# Patient Record
Sex: Male | Born: 1985 | Race: White | Hispanic: No | Marital: Married | Smoking: Current some day smoker
Health system: Southern US, Community
[De-identification: ages and names within clinical notes are randomized; demographics above are authoritative.]

## PROBLEM LIST (undated history)

## (undated) ENCOUNTER — Ambulatory Visit (HOSPITAL_COMMUNITY): Admission: EM | Payer: No Typology Code available for payment source

## (undated) DIAGNOSIS — K589 Irritable bowel syndrome without diarrhea: Secondary | ICD-10-CM

## (undated) DIAGNOSIS — H9193 Unspecified hearing loss, bilateral: Secondary | ICD-10-CM

## (undated) DIAGNOSIS — D321 Benign neoplasm of spinal meninges: Secondary | ICD-10-CM

## (undated) DIAGNOSIS — N200 Calculus of kidney: Secondary | ICD-10-CM

## (undated) DIAGNOSIS — J189 Pneumonia, unspecified organism: Secondary | ICD-10-CM

## (undated) HISTORY — DX: Benign neoplasm of spinal meninges: D32.1

## (undated) HISTORY — DX: Unspecified hearing loss, bilateral: H91.93

## (undated) HISTORY — DX: Pneumonia, unspecified organism: J18.9

## (undated) HISTORY — DX: Calculus of kidney: N20.0

## (undated) HISTORY — DX: Irritable bowel syndrome, unspecified: K58.9

---

## 2021-03-24 ENCOUNTER — Other Ambulatory Visit: Payer: Self-pay

## 2021-03-24 ENCOUNTER — Encounter (HOSPITAL_COMMUNITY): Payer: Self-pay

## 2021-03-24 ENCOUNTER — Emergency Department (HOSPITAL_COMMUNITY): Payer: No Typology Code available for payment source

## 2021-03-24 ENCOUNTER — Emergency Department (HOSPITAL_COMMUNITY)
Admission: EM | Admit: 2021-03-24 | Discharge: 2021-03-24 | Disposition: A | Discharge status: Home / Self Care | Payer: No Typology Code available for payment source | Attending: Emergency Medicine | Admitting: Emergency Medicine

## 2021-03-24 DIAGNOSIS — R42 Dizziness and giddiness: Secondary | ICD-10-CM | POA: Insufficient documentation

## 2021-03-24 DIAGNOSIS — R1013 Epigastric pain: Secondary | ICD-10-CM | POA: Insufficient documentation

## 2021-03-24 DIAGNOSIS — R1011 Right upper quadrant pain: Secondary | ICD-10-CM | POA: Insufficient documentation

## 2021-03-24 DIAGNOSIS — K921 Melena: Secondary | ICD-10-CM | POA: Diagnosis present

## 2021-03-24 LAB — COMPREHENSIVE METABOLIC PANEL
ALT: 20 U/L (ref 0–44)
AST: 24 U/L (ref 15–41)
Albumin: 4.5 g/dL (ref 3.5–5.0)
Alkaline Phosphatase: 48 U/L (ref 38–126)
Anion gap: 8 (ref 5–15)
BUN: 16 mg/dL (ref 6–20)
CO2: 27 mmol/L (ref 22–32)
Calcium: 8.9 mg/dL (ref 8.9–10.3)
Chloride: 104 mmol/L (ref 98–111)
Creatinine, Ser: 0.87 mg/dL (ref 0.61–1.24)
GFR, Estimated: >60 mL/min (ref >60)
Glucose, Bld: 94 mg/dL (ref 70–99)
Potassium: 3.7 mmol/L (ref 3.5–5.1)
Sodium: 139 mmol/L (ref 135–145)
Total Bilirubin: 0.4 mg/dL (ref 0.3–1.2)
Total Protein: 7.6 g/dL (ref 6.5–8.1)

## 2021-03-24 LAB — CBC WITH DIFFERENTIAL/PLATELET
Abs Immature Granulocytes: 0.05 10*3/uL (ref 0.00–0.07)
Basophils Absolute: 0.1 10*3/uL (ref 0.0–0.1)
Basophils Relative: 1 %
Eosinophils Absolute: 0.1 10*3/uL (ref 0.0–0.5)
Eosinophils Relative: 1 %
HCT: 42.6 % (ref 39.0–52.0)
Hemoglobin: 14.1 g/dL (ref 13.0–17.0)
Immature Granulocytes: 1 %
Lymphocytes Relative: 33 %
Lymphs Abs: 2.8 10*3/uL (ref 0.7–4.0)
MCH: 29.6 pg (ref 26.0–34.0)
MCHC: 33.1 g/dL (ref 30.0–36.0)
MCV: 89.3 fL (ref 80.0–100.0)
Monocytes Absolute: 0.5 10*3/uL (ref 0.1–1.0)
Monocytes Relative: 6 %
Neutro Abs: 5 10*3/uL (ref 1.7–7.7)
Neutrophils Relative %: 58 %
Platelets: 196 10*3/uL (ref 150–400)
RBC: 4.77 MIL/uL (ref 4.22–5.81)
RDW: 12.7 % (ref 11.5–15.5)
WBC: 8.6 10*3/uL (ref 4.0–10.5)
nRBC: 0 % (ref 0.0–0.2)

## 2021-03-24 LAB — POC OCCULT BLOOD, ED: Fecal Occult Bld: POSITIVE — AB

## 2021-03-24 LAB — LIPASE, BLOOD: Lipase: 32 U/L (ref 11–51)

## 2021-03-24 MED ORDER — IOHEXOL 300 MG/ML  SOLN
100.0000 mL | Freq: Once | INTRAMUSCULAR | Status: AC | PRN
  Administered 2021-03-24: 100 mL via INTRAVENOUS

## 2021-03-24 MED ORDER — PANTOPRAZOLE SODIUM 20 MG PO TBEC: 20.0000 mg | DELAYED_RELEASE_TABLET | Freq: Every day | ORAL | 0 refills | Status: AC

## 2021-03-24 NOTE — ED Triage Notes (Signed)
Pt reports bleeding from his rectum x 3 days. Pt states that his stool has clots and light red bleeding. Pt reports feeling tired.  ?

## 2021-03-24 NOTE — ED Notes (Signed)
Pt NAD, a/ox4. Pt verbalizes understanding of all DC and f/u instructions. All questions answered. Pt walks with steady gait to lobby at DC.  ? ?

## 2021-03-24 NOTE — ED Notes (Signed)
Pt NAD in bed, a/ox4, c/o RUQ abd pain and bright red rectal bleeding. Pt states pain increases after eating, family HX gallstones. ABD soft, tender to RUQ.  ?

## 2021-03-24 NOTE — ED Provider Notes (Signed)
?[place] DEPT ?Provider Note ? ? ?CSN: [id number] ?Arrival date & time: 03/24/21  1943 ? ?  ? ?History ? ?Chief Complaint  ?Patient presents with  ? Blood In Stools  ? ? ?David Rivers. is a 36 y.o. male. ? ?Patient is a 36 year old male who presents with blood in the stool.  He has had some pain in his right upper abdomen for about 3 to 4 days.  He is also noticed some blood in his stools for the same amount of time.  It got worse today.  He started having pure blood in the toilet.  He has had 3 episodes of bloody stools today.  He denies any nausea or vomiting.  He felt a little foggy and a little bit dizzy earlier today.  No known fevers.  He said he had some blood in his stools several months ago but it went away pretty fast and he thought it was just hemorrhoids. ? ? ?  ? ?Home Medications ?Prior to Admission medications   ?Medication Sig Start Date End Date Taking? Authorizing Provider  ?pantoprazole (PROTONIX) 20 MG tablet Take 1 tablet (20 mg total) by mouth daily. 03/24/21  Yes Malvin Johns, MD  ?   ? ?Allergies    ?Patient has no allergy information on record.   ? ?Review of Systems   ?Review of Systems  ?Constitutional:  Negative for chills, diaphoresis, fatigue and fever.  ?HENT:  Negative for congestion, rhinorrhea and sneezing.   ?Eyes: Negative.   ?Respiratory:  Negative for cough, chest tightness and shortness of breath.   ?Cardiovascular:  Negative for chest pain and leg swelling.  ?Gastrointestinal:  Positive for abdominal pain and blood in stool. Negative for diarrhea, nausea and vomiting.  ?Genitourinary:  Negative for difficulty urinating, flank pain, frequency and hematuria.  ?Musculoskeletal:  Negative for arthralgias and back pain.  ?Skin:  Negative for rash.  ?Neurological:  Negative for dizziness, speech difficulty, weakness, numbness and headaches.  ? ?Physical Exam ?Updated Vital Signs ?BP 136/90   Pulse 72   Temp 98.1 ?F (36.7 ?C) (Oral)   Resp  16   Ht 6' (1.829 m)   Wt 90.7 kg   SpO2 98%   BMI 27.12 kg/m?  ?Physical Exam ?Constitutional:   ?   Appearance: He is well-developed.  ?HENT:  ?   Head: Normocephalic and atraumatic.  ?Eyes:  ?   Pupils: Pupils are equal, round, and reactive to light.  ?Cardiovascular:  ?   Rate and Rhythm: Normal rate and regular rhythm.  ?   Heart sounds: Normal heart sounds.  ?Pulmonary:  ?   Effort: Pulmonary effort is normal. No respiratory distress.  ?   Breath sounds: Normal breath sounds. No wheezing or rales.  ?Chest:  ?   Chest wall: No tenderness.  ?Abdominal:  ?   General: Bowel sounds are normal.  ?   Palpations: Abdomen is soft.  ?   Tenderness: There is no abdominal tenderness (RUQ, epigastric). There is no guarding or rebound.  ?Genitourinary: ?   Comments: Rectal exam was negative for stool, negative for gross blood ?Musculoskeletal:     ?   General: Normal range of motion.  ?   Cervical back: Normal range of motion and neck supple.  ?Lymphadenopathy:  ?   Cervical: No cervical adenopathy.  ?Skin: ?   General: Skin is warm and dry.  ?   Findings: No rash.  ?Neurological:  ?   Mental Status: He  is alert and oriented to person, place, and time.  ? ? ?ED Results / Procedures / Treatments   ?Labs ?(all labs ordered are listed, but only abnormal results are displayed) ?Labs Reviewed  ?POC OCCULT BLOOD, ED - Abnormal; Notable for the following components:  ?    Result Value  ? Fecal Occult Bld POSITIVE (*)   ? All other components within normal limits  ?CBC WITH DIFFERENTIAL/PLATELET  ?COMPREHENSIVE METABOLIC PANEL  ?LIPASE, BLOOD  ? ? ?EKG ?None ? ?Radiology ?CT Abdomen Pelvis W Contrast ? ?Result Date: 03/24/2021 ?CLINICAL DATA:  Acute abdominal pain and rectal bleeding, initial encounter EXAM: CT ABDOMEN AND PELVIS WITH CONTRAST TECHNIQUE: Multidetector CT imaging of the abdomen and pelvis was performed using the standard protocol following bolus administration of intravenous contrast. RADIATION DOSE REDUCTION:  This exam was performed according to the departmental dose-optimization program which includes automated exposure control, adjustment of the mA and/or kV according to patient size and/or use of iterative reconstruction technique. CONTRAST:  164m OMNIPAQUE IOHEXOL 300 MG/ML  SOLN COMPARISON:  None. FINDINGS: Lower chest: No acute abnormality. Hepatobiliary: No focal liver abnormality is seen. No gallstones, gallbladder wall thickening, or biliary dilatation. Pancreas: Unremarkable. No pancreatic ductal dilatation or surrounding inflammatory changes. Spleen: Normal in size without focal abnormality. Adrenals/Urinary Tract: Adrenal glands are within normal limits. Kidneys are within normal limits as well. No renal calculi are seen. The ureters are within normal limits. Bladder is well distended. Stomach/Bowel: The appendix is within normal limits. No obstructive or inflammatory changes of the colon are seen. Small bowel and stomach are unremarkable. Vascular/Lymphatic: No significant vascular findings are present. No enlarged abdominal or pelvic lymph nodes. Reproductive: Prostate is unremarkable. Other: No abdominal wall hernia or abnormality. No abdominopelvic ascites. Musculoskeletal: No acute or significant osseous findings. IMPRESSION: No acute abnormality noted to correspond with the patient's given clinical symptomatology. Electronically Signed   By: MInez CatalinaM.D.   On: 03/24/2021 21:57   ? ?Procedures ?Procedures  ? ? ?Medications Ordered in ED ?Medications  ?iohexol (OMNIPAQUE) 300 MG/ML solution 100 mL (100 mLs Intravenous Contrast Given 03/24/21 2146)  ? ? ?ED Course/ Medical Decision Making/ A&P ?  ?                        ?Medical Decision Making ?Problems Addressed: ?Epigastric pain: undiagnosed new problem with uncertain prognosis ?Hematochezia: undiagnosed new problem with uncertain prognosis ? ?Amount and/or Complexity of Data Reviewed ?Labs: ordered. Decision-making details documented in ED  Course. ?Radiology: ordered. Decision-making details documented in ED Course. ? ?Risk ?Prescription drug management. ?Decision regarding hospitalization. ? ? ?Patient is a 36year old male who presents with bright red blood per rectum.  He also has some right upper quadrant and epigastric abdominal pain.  His labs are nonconcerning.  His lipase is normal without suggestions of pancreatitis.  His LFTs are normal.  His hemoglobin is normal.  Rectal exam showed no stool or blood.  It was Hemoccult positive however.  There is no visible external hemorrhoids noted.  Given his abdominal pain, CT scan was performed which showed no acute abnormalities.  There is no gallstones or gallbladder wall thickening.  He has minimal tenderness on exam.  He seems appropriate for outpatient treatment.  At this point he does not have any anemia, hypotension or ongoing bleeding that would warrant inpatient treatment.  I will give him a referral to follow-up with gastroenterology.  Return precautions were discussed.  We will also start  Protonix for his upper abdominal pain. ? ?{Final Clinical Impression(s) / ED Diagnoses ?Final diagnoses:  ?Hematochezia  ?Epigastric pain  ? ? ?Rx / DC Orders ?ED Discharge Orders   ? ?      Ordered  ?  pantoprazole (PROTONIX) 20 MG tablet  Daily       ? 03/24/21 2219  ? ?  ?  ? ?  ? ? ?  ?Malvin Johns, MD ?03/24/21 2224 ? ?

## 2021-03-24 NOTE — Discharge Instructions (Signed)
Follow-up with the gastroenterologist as discussed.  Return to the emergency room if you have any worsening symptoms. ?

## 2021-03-24 NOTE — ED Notes (Signed)
Patient transported to CT 

## 2021-03-26 ENCOUNTER — Encounter: Payer: Self-pay | Admitting: Gastroenterology

## 2021-04-15 ENCOUNTER — Ambulatory Visit: Payer: No Typology Code available for payment source | Admitting: Gastroenterology

## 2021-04-15 ENCOUNTER — Other Ambulatory Visit: Payer: No Typology Code available for payment source

## 2021-04-15 ENCOUNTER — Encounter: Payer: Self-pay | Admitting: Gastroenterology

## 2021-04-15 VITALS — BP 120/72 | HR 78 | Ht 72.0 in | Wt 233.0 lb

## 2021-04-15 DIAGNOSIS — R195 Other fecal abnormalities: Secondary | ICD-10-CM | POA: Diagnosis not present

## 2021-04-15 DIAGNOSIS — K219 Gastro-esophageal reflux disease without esophagitis: Secondary | ICD-10-CM

## 2021-04-15 DIAGNOSIS — K921 Melena: Secondary | ICD-10-CM

## 2021-04-15 MED ORDER — PLENVU 140 G PO SOLR: ORAL | 0 refills | Status: DC

## 2021-04-15 NOTE — Patient Instructions (Addendum)
If you are age 34 or older, your body mass index should be between 23-30. Your Body mass index is 31.6 kg/m?Marland Kitchen If this is out of the aforementioned range listed, please consider follow up with your Primary Care Provider. ? ?If you are age 41 or younger, your body mass index should be between 19-25. Your Body mass index is 31.6 kg/m?Marland Kitchen If this is out of the aformentioned range listed, please consider follow up with your Primary Care Provider.  ? ?You have been scheduled for a colonoscopy. Please follow written instructions given to you at your visit today.  ?Please pick up your prep supplies at the pharmacy within the next 1-3 days. ?If you use inhalers (even only as needed), please bring them with you on the day of your procedure. ? ? ?Your provider has requested that you go to the basement level for lab work before leaving today. Press "B" on the elevator. The lab is located at the first door on the left as you exit the elevator.  ? ?Take Protonix daily.  ? ?Increase dietary fiber. ? ? ?The [facility] providers would like to encourage you to use [facility] to communicate with providers for non-urgent requests or questions.  Due to long hold times on the telephone, sending your provider a message by [facility] may be a faster and more efficient way to get a response.  Please allow 48 business hours for a response.  Please remember that this is for non-urgent requests.  ? ?It was a pleasure to see you today! ? ?Thank you for trusting me with your gastrointestinal care!   ? ?Scott E.Candis Schatz, MD  ?

## 2021-04-15 NOTE — Progress Notes (Signed)
? ?HPI : David Rivers is a very pleasant 36 year old male who presents to [place] with symptoms of rectal bleeding and dyspepsia.  He for started noticing blood in his stool about a year ago.  He will see blood sometimes mixed within his stool, sometimes just on the toilet paper and sometimes he just passes blood without passing any stool.  He denies any pain with the passage of stool and denies feeling any lumps bumps or protrusions in the perianal area.  He reports having normal bowel habits, usually with 2 bowel movements per day.  The consistency of his stool varies quite a bit from soft formed stool to loose, mushy stools, rarely with hard stools. ?He has also been having upper GI symptoms recently consisting of a right upper quadrant discomfort, heartburn and excessive belching dyspepsia after meals.  He went to the emergency department earlier this month because of the bleeding rectal bleeding and the pain.  Fecal occult blood test in the ED was positive.  CT scan was unremarkable.  Liver enzymes and lipase were normal.  He was started on Protonix, and this seems to have helped with his upper GI symptoms.  He is also started eating healthier since that ED visit, decreasing consumption of fried and fatty foods, and eating more fruits and vegetables. ? ?He denies any family history of celiac disease, inflammatory bowel disease or gallbladder disease.  No family history of GI malignancy.  He has never had an upper or lower endoscopy.  No unintentional weight loss. ? ?Past Medical History:  ?Diagnosis Date  ? IBS (irritable bowel syndrome)   ? Kidney stones   ? Partial deafness of both ears   ? Pneumonia   ? Spinal meningioma ([place])   ? ? ? ?History reviewed. No pertinent surgical history. ?Family History  ?Problem Relation Age of Onset  ? Multiple sclerosis Mother   ? Liver cancer Maternal Grandfather   ?     kidney cancer  ? COPD Paternal Grandmother   ? Breast cancer Paternal Grandmother 24  ? COPD Paternal  Grandfather   ? Diabetes Paternal Grandfather   ? Colon cancer Neg Hx   ? ?Social History  ? ?Tobacco Use  ? Smoking status: Some Days  ?  Types: Cigarettes  ? Smokeless tobacco: Current  ?  Types: Chew  ?Vaping Use  ? Vaping Use: Never used  ?Substance Use Topics  ? Alcohol use: Yes  ?  Comment: occ  ? Drug use: Never  ? ?Current Outpatient Medications  ?Medication Sig Dispense Refill  ? Multiple Vitamin (MULTIVITAMIN) capsule Take 1 capsule by mouth daily.    ? pantoprazole (PROTONIX) 20 MG tablet Take 1 tablet (20 mg total) by mouth daily. (Patient taking differently: Take 20 mg by mouth daily as needed.) 30 tablet 0  ? ?No current facility-administered medications for this visit.  ? ?No Known Allergies ? ? ?Review of Systems: ?All systems reviewed and negative except where noted in HPI.  ? ? ?CT Abdomen Pelvis W Contrast ? ?Result Date: 03/24/2021 ?CLINICAL DATA:  Acute abdominal pain and rectal bleeding, initial encounter EXAM: CT ABDOMEN AND PELVIS WITH CONTRAST TECHNIQUE: Multidetector CT imaging of the abdomen and pelvis was performed using the standard protocol following bolus administration of intravenous contrast. RADIATION DOSE REDUCTION: This exam was performed according to the departmental dose-optimization program which includes automated exposure control, adjustment of the mA and/or kV according to patient size and/or use of iterative reconstruction technique. CONTRAST:  195m OMNIPAQUE IOHEXOL  300 MG/ML  SOLN COMPARISON:  None. FINDINGS: Lower chest: No acute abnormality. Hepatobiliary: No focal liver abnormality is seen. No gallstones, gallbladder wall thickening, or biliary dilatation. Pancreas: Unremarkable. No pancreatic ductal dilatation or surrounding inflammatory changes. Spleen: Normal in size without focal abnormality. Adrenals/Urinary Tract: Adrenal glands are within normal limits. Kidneys are within normal limits as well. No renal calculi are seen. The ureters are within normal limits.  Bladder is well distended. Stomach/Bowel: The appendix is within normal limits. No obstructive or inflammatory changes of the colon are seen. Small bowel and stomach are unremarkable. Vascular/Lymphatic: No significant vascular findings are present. No enlarged abdominal or pelvic lymph nodes. Reproductive: Prostate is unremarkable. Other: No abdominal wall hernia or abnormality. No abdominopelvic ascites. Musculoskeletal: No acute or significant osseous findings. IMPRESSION: No acute abnormality noted to correspond with the patient's given clinical symptomatology. Electronically Signed   By: Inez Catalina M.D.   On: 03/24/2021 21:57   ? ?Physical Exam: ?BP 120/72   Pulse 78   Ht 6' (1.829 m)   Wt 233 lb (105.7 kg)   SpO2 96%   BMI 31.60 kg/m?  ?Constitutional: Pleasant,well-developed, Caucasian male in no acute distress. ?HEENT: Normocephalic and atraumatic. Conjunctivae are normal. No scleral icterus. ?Neck supple.  ?Cardiovascular: Normal rate, regular rhythm.  ?Pulmonary/chest: Effort normal and breath sounds normal. No wheezing, rales or rhonchi. ?Abdominal: Soft, nondistended, mild tenderness to palpation in the epigastrium, no rigidity or guarding. Bowel sounds active throughout. There are no masses palpable. No hepatomegaly. ?Extremities: no edema ?Rectal: Deferred until time of colonoscopy ?Neurological: Alert and oriented to person place and time. ?Skin: Skin is warm and dry. No rashes noted. ?Psychiatric: Normal mood and affect. Behavior is normal. ? ?CBC ?   ?Component Value Date/Time  ? WBC 8.6 03/24/2021 2006  ? RBC 4.77 03/24/2021 2006  ? HGB 14.1 03/24/2021 2006  ? HCT 42.6 03/24/2021 2006  ? PLT 196 03/24/2021 2006  ? MCV 89.3 03/24/2021 2006  ? MCH 29.6 03/24/2021 2006  ? MCHC 33.1 03/24/2021 2006  ? RDW 12.7 03/24/2021 2006  ? LYMPHSABS 2.8 03/24/2021 2006  ? MONOABS 0.5 03/24/2021 2006  ? EOSABS 0.1 03/24/2021 2006  ? BASOSABS 0.1 03/24/2021 2006  ? ? ?CMP  ?   ?Component Value Date/Time  ?  NA 139 03/24/2021 2006  ? K 3.7 03/24/2021 2006  ? CL 104 03/24/2021 2006  ? CO2 27 03/24/2021 2006  ? GLUCOSE 94 03/24/2021 2006  ? BUN 16 03/24/2021 2006  ? CREATININE 0.87 03/24/2021 2006  ? CALCIUM 8.9 03/24/2021 2006  ? PROT 7.6 03/24/2021 2006  ? ALBUMIN 4.5 03/24/2021 2006  ? AST 24 03/24/2021 2006  ? ALT 20 03/24/2021 2006  ? ALKPHOS 48 03/24/2021 2006  ? BILITOT 0.4 03/24/2021 2006  ? GFRNONAA >60 03/24/2021 2006  ? ? ? ?ASSESSMENT AND PLAN: ?36 year old male with less than 1 year history of intermittent hematochezia, recently worsened in terms of volume and frequency.  He is also been having upper GI symptoms of dyspepsia, excessive belching and bloating.  Bowel movements have been normal.  He has no other perianal symptoms such as pain with defecation or itching/burning.  I told him that his bleeding is almost certainly from internal hemorrhoids, but a colonoscopy is reasonable to exclude other etiologies.  The patient has a positive fecal occult blood test, which essentially mandates a diagnostic colonoscopy at this point. ?With regards to his upper GI symptoms, recommended he take the Protonix every day not just  as needed for least a month.  I would like to check an H. pylori stool antigen and also screen for celiac disease.  I recommended that he start taking a daily fiber supplement to increase his stool bulk and consistency and potentially reduce hemorrhoidal bleeding. ? ?Hematochezia/positive FOBT, suspected hemorrhoids ?- Colonoscopy ?-Fiber supplementation ? ?GERD/dyspepsia ?-Protonix daily, not PRN ?-H. Pylori stool antigen ?-Ttg/IGA ? ?The details, risks (including bleeding, perforation, infection, missed lesions, medication reactions and possible hospitalization or surgery if complications occur), benefits, and alternatives to colonoscopy with possible biopsy and possible polypectomy were discussed with the patient and he consents to proceed.  ? ?Maryln Eastham E. Candis Schatz, MD ?[facility]  Gastroenterology ? ? ?No ref. provider found ? ?

## 2021-04-16 ENCOUNTER — Encounter: Payer: Self-pay | Admitting: Gastroenterology

## 2021-04-16 LAB — IGA: Immunoglobulin A: 190 mg/dL (ref 47–310)

## 2021-04-16 LAB — TISSUE TRANSGLUTAMINASE, IGA: (tTG) Ab, IgA: <1 U/mL

## 2021-05-07 ENCOUNTER — Encounter: Payer: Self-pay | Admitting: Gastroenterology

## 2021-05-13 ENCOUNTER — Telehealth: Payer: Self-pay | Admitting: Gastroenterology

## 2021-05-13 ENCOUNTER — Other Ambulatory Visit: Payer: Self-pay

## 2021-05-13 MED ORDER — PLENVU 140 G PO SOLR: ORAL | 0 refills | Status: DC

## 2021-05-13 NOTE — Telephone Encounter (Signed)
Patient called states he call the pharmacy and they do not have his prep medication.  ?

## 2021-05-13 NOTE — Telephone Encounter (Signed)
Called pharmacy and spoke with the Pharmacist sunjay who stated that they had the prescription but they did not have the medication in stock. Attempted to call patient and alternate contact to pick up prep kit but received no answer.  ?

## 2021-05-13 NOTE — Telephone Encounter (Signed)
Left VM for patient to call back immediately to discuss prep options  ?

## 2021-05-13 NOTE — Telephone Encounter (Signed)
Patient rescheduled for May 9th and will come pick up prep kit tomorrow. ?

## 2021-05-13 NOTE — Telephone Encounter (Signed)
Patient called to reschedule his procedure for tomorrow. Patient rescheduled for 05/27/21. Per patient, didn't pick up medications on time.  ?

## 2021-05-14 ENCOUNTER — Encounter: Payer: No Typology Code available for payment source | Admitting: Gastroenterology

## 2021-05-26 NOTE — Telephone Encounter (Signed)
Patient called to pick up his pre medication call front desk and it was not available please confirm and call patient to let him know. ?

## 2021-05-26 NOTE — Telephone Encounter (Signed)
Returned patient call and let him I did have a prep for him to come pick it up on the 2nd floor. Patient informed me that he needed to go over prep instructions again before he leaves. ?

## 2021-05-26 NOTE — Telephone Encounter (Signed)
Patient came by office and we went over instructions together. Patient stated he ate sunflowers seeds 1hour prior to coming to the office. Per Dr.Cunningham patient can still have procedure. Patient called once returning home stating that he also had beef jerky earlier. I let patient know that as long as he DID NOT eat anything else he could have his procedure. Patient asked if it was a banana or a banana flavored popsicle he was able to have. I let patient know that it was popsicle and he could not have anymore solid foods until after his procedure. ?

## 2021-05-27 ENCOUNTER — Encounter: Payer: Self-pay | Admitting: Gastroenterology

## 2021-05-27 ENCOUNTER — Ambulatory Visit (AMBULATORY_SURGERY_CENTER): Payer: No Typology Code available for payment source | Admitting: Gastroenterology

## 2021-05-27 VITALS — BP 108/73 | HR 63 | Temp 98.6°F | Resp 20 | Ht 72.0 in | Wt 233.0 lb

## 2021-05-27 DIAGNOSIS — R195 Other fecal abnormalities: Secondary | ICD-10-CM

## 2021-05-27 DIAGNOSIS — K921 Melena: Secondary | ICD-10-CM

## 2021-05-27 DIAGNOSIS — K635 Polyp of colon: Secondary | ICD-10-CM

## 2021-05-27 DIAGNOSIS — K640 First degree hemorrhoids: Secondary | ICD-10-CM | POA: Diagnosis not present

## 2021-05-27 DIAGNOSIS — D123 Benign neoplasm of transverse colon: Secondary | ICD-10-CM

## 2021-05-27 MED ORDER — SODIUM CHLORIDE 0.9 % IV SOLN: 500.0000 mL | Freq: Once | INTRAVENOUS | Status: DC

## 2021-05-27 NOTE — Progress Notes (Signed)
Called to room to assist during endoscopic procedure.  Patient ID and intended procedure confirmed with present staff. Received instructions for my participation in the procedure from the performing physician.  

## 2021-05-27 NOTE — Progress Notes (Signed)
Pt's states no medical or surgical changes since previsit or office visit. VS assessed by C.W 

## 2021-05-27 NOTE — Patient Instructions (Signed)
Handouts provided on polyps, hemorrhoids and high-fiber diet.  ? ?Recommend fiber supplementation to reduce hemorrhoidal bleeding.  ? ?YOU HAD AN ENDOSCOPIC PROCEDURE TODAY AT [facility]:   Refer to the procedure report that was given to you for any specific questions about what was found during the examination.  If the procedure report does not answer your questions, please call your gastroenterologist to clarify.  If you requested that your care partner not be given the details of your procedure findings, then the procedure report has been included in a sealed envelope for you to review at your convenience later. ? ?YOU SHOULD EXPECT: Some feelings of bloating in the abdomen. Passage of more gas than usual.  Walking can help get rid of the air that was put into your GI tract during the procedure and reduce the bloating. If you had a lower endoscopy (such as a colonoscopy or flexible sigmoidoscopy) you may notice spotting of blood in your stool or on the toilet paper. If you underwent a bowel prep for your procedure, you may not have a normal bowel movement for a few days. ? ?Please Note:  You might notice some irritation and congestion in your nose or some drainage.  This is from the oxygen used during your procedure.  There is no need for concern and it should clear up in a day or so. ? ?SYMPTOMS TO REPORT IMMEDIATELY: ? ?Following lower endoscopy (colonoscopy or flexible sigmoidoscopy): ? Excessive amounts of blood in the stool ? Significant tenderness or worsening of abdominal pains ? Swelling of the abdomen that is new, acute ? Fever of 100?F or higher ? ?For urgent or emergent issues, a gastroenterologist can be reached at any hour by calling [phone]. ?Do not use MyChart messaging for urgent concerns.  ? ? ?DIET:  We do recommend a small meal at first, but then you may proceed to your regular diet.  Drink plenty of fluids but you should avoid alcoholic beverages for 24  hours. ? ?ACTIVITY:  You should plan to take it easy for the rest of today and you should NOT DRIVE or use heavy machinery until tomorrow (because of the sedation medicines used during the test).   ? ?FOLLOW UP: ?Our staff will call the number listed on your records 48-72 hours following your procedure to check on you and address any questions or concerns that you may have regarding the information given to you following your procedure. If we do not reach you, we will leave a message.  We will attempt to reach you two times.  During this call, we will ask if you have developed any symptoms of COVID 19. If you develop any symptoms (ie: fever, flu-like symptoms, shortness of breath, cough etc.) before then, please call [phone].  If you test positive for Covid 19 in the 2 weeks post procedure, please call and report this information to [place].   ? ?If any biopsies were taken you will be contacted by phone or by letter within the next 1-3 weeks.  Please call us at [phone] if you have not heard about the biopsies in 3 weeks.  ? ? ?SIGNATURES/CONFIDENTIALITY: ?You and/or your care partner have signed paperwork which will be entered into your electronic medical record.  These signatures attest to the fact that that the information above on your After Visit Summary has been reviewed and is understood.  Full responsibility of the confidentiality of this discharge information lies with you and/or your care-partner. ? ?

## 2021-05-27 NOTE — Op Note (Signed)
[facility] ?Patient Name: David Rivers ?Procedure Date: 05/27/2021 3:38 PM ?MRN: [id number] ?Endoscopist: Raad Clayson E. Candis Schatz , MD ?Age: 36 ?Referring MD:  ?Date of Birth: 1985-10-09 ?Gender: Male ?Account #: [id number] ?Procedure:                Colonoscopy ?Indications:              Hematochezia, Positive fecal immunochemical test ?Medicines:                Monitored Anesthesia Care ?Procedure:                Pre-Anesthesia Assessment: ?                          - Prior to the procedure, a History and Physical  ?                          was performed, and patient medications and  ?                          allergies were reviewed. The patient's tolerance of  ?                          previous anesthesia was also reviewed. The risks  ?                          and benefits of the procedure and the sedation  ?                          options and risks were discussed with the patient.  ?                          All questions were answered, and informed consent  ?                          was obtained. Prior Anticoagulants: The patient has  ?                          taken no previous anticoagulant or antiplatelet  ?                          agents. ASA Grade Assessment: II - A patient with  ?                          mild systemic disease. After reviewing the risks  ?                          and benefits, the patient was deemed in  ?                          satisfactory condition to undergo the procedure. ?                          After obtaining informed consent, the colonoscope  ?  was passed under direct vision. Throughout the  ?                          procedure, the patient's blood pressure, pulse, and  ?                          oxygen saturations were monitored continuously. The  ?                          Olympus CF-HQ190L ([id number]) Colonoscope was  ?                          introduced through the anus and advanced to the the  ?                          cecum,  identified by appendiceal orifice and  ?                          ileocecal valve. The colonoscopy was performed  ?                          without difficulty. The patient tolerated the  ?                          procedure well. The quality of the bowel  ?                          preparation was adequate. The terminal ileum,  ?                          ileocecal valve, appendiceal orifice, and rectum  ?                          were photographed. The bowel preparation used was  ?                          Plenvu via split dose instruction. ?Scope In: 3:53:25 PM ?Scope Out: 4:09:41 PM ?Scope Withdrawal Time: 0 hours 13 minutes 9 seconds  ?Total Procedure Duration: 0 hours 16 minutes 16 seconds  ?Findings:                 The perianal and digital rectal examinations were  ?                          normal. Pertinent negatives include normal  ?                          sphincter tone and no palpable rectal lesions. ?                          A 8 mm polyp was found in the transverse colon. The  ?                          polyp was sessile. The polyp was removed with a  ?  cold snare. Resection and retrieval were complete.  ?                          Estimated blood loss was minimal. ?                          The exam was otherwise normal throughout the  ?                          examined colon. ?                          The terminal ileum appeared normal. ?                          Non-bleeding internal hemorrhoids were found during  ?                          retroflexion. The hemorrhoids were Grade I  ?                          (internal hemorrhoids that do not prolapse). ?                          No additional abnormalities were found on  ?                          retroflexion. ?Complications:            No immediate complications. ?Estimated Blood Loss:     Estimated blood loss was minimal. ?Impression:               - One 8 mm polyp in the transverse colon, removed  ?                           with a cold snare. Resected and retrieved. ?                          - The examined portion of the ileum was normal. ?                          - Non-bleeding internal hemorrhoids. This is the  ?                          source of the patient's hematochezia and positive  ?                          FIT. ?Recommendation:           - Patient has a contact number available for  ?                          emergencies. The signs and symptoms of potential  ?                          delayed complications were discussed with the  ?  patient. Return to normal activities tomorrow.  ?                          Written discharge instructions were provided to the  ?                          patient. ?                          - Resume previous diet. ?                          - Continue present medications. ?                          - Await pathology results. ?                          - Repeat colonoscopy (date not yet determined) for  ?                          surveillance based on pathology results. ?                          - Recommend fiber supplementation to reduce  ?                          hemorrhoidal bleeding. ?Brittane Grudzinski E. Candis Schatz, MD ?05/27/2021 4:16:40 PM ?This report has been signed electronically. ?

## 2021-05-27 NOTE — Progress Notes (Signed)
Pt non-responsive, VVS, Report to RN  °

## 2021-05-27 NOTE — Progress Notes (Signed)
[facility] History and Physical ? ? ?Primary Care Physician:  Pcp, No ? ? ?Reason for Procedure:   Hematochezia/positive FOBT ? ?Plan:    Colonoscopy ? ? ? ? ?HPI: David Rivers. is a 36 y.o. male undergoing colonoscopy to evaluate recurrent hematochezia and positive FOBT ? ?Past Medical History:  ?Diagnosis Date  ? IBS (irritable bowel syndrome)   ? Kidney stones   ? Partial deafness of both ears   ? Pneumonia   ? Spinal meningioma ([place])   ? ? ?History reviewed. No pertinent surgical history. ? ?Prior to Admission medications   ?Medication Sig Start Date End Date Taking? Authorizing Provider  ?Multiple Vitamin (MULTIVITAMIN) capsule Take 1 capsule by mouth daily.   Yes [provider]  ?pantoprazole (PROTONIX) 20 MG tablet Take 1 tablet (20 mg total) by mouth daily. ?Patient taking differently: Take 20 mg by mouth daily as needed. 03/24/21   Malvin Johns, MD  ? ? ?Current Outpatient Medications  ?Medication Sig Dispense Refill  ? Multiple Vitamin (MULTIVITAMIN) capsule Take 1 capsule by mouth daily.    ? pantoprazole (PROTONIX) 20 MG tablet Take 1 tablet (20 mg total) by mouth daily. (Patient taking differently: Take 20 mg by mouth daily as needed.) 30 tablet 0  ? ?Current Facility-Administered Medications  ?Medication Dose Route Frequency Provider Last Rate Last Admin  ? 0.9 %  sodium chloride infusion  500 mL Intravenous Once Daryel November, MD      ? ? ?Allergies as of 05/27/2021  ? (No Known Allergies)  ? ? ?Family History  ?Problem Relation Age of Onset  ? Multiple sclerosis Mother   ? Liver cancer Maternal Grandfather   ?     kidney cancer  ? COPD Paternal Grandmother   ? Breast cancer Paternal Grandmother 77  ? COPD Paternal Grandfather   ? Diabetes Paternal Grandfather   ? Colon cancer Neg Hx   ? ? ?Social History  ? ?Socioeconomic History  ? Marital status: Married  ?  Spouse name: Not on file  ? Number of children: Not on file  ? Years of education: Not on file  ?  Highest education level: Not on file  ?Occupational History  ? Not on file  ?Tobacco Use  ? Smoking status: Some Days  ?  Types: Cigarettes  ? Smokeless tobacco: Current  ?  Types: Chew  ?Vaping Use  ? Vaping Use: Never used  ?Substance and Sexual Activity  ? Alcohol use: Yes  ?  Comment: occ  ? Drug use: Never  ? Sexual activity: Not on file  ?Other Topics Concern  ? Not on file  ?Social History Narrative  ? Not on file  ? ?Social Determinants of Health  ? ?Financial Resource Strain: Not on file  ?Food Insecurity: Not on file  ?Transportation Needs: Not on file  ?Physical Activity: Not on file  ?Stress: Not on file  ?Social Connections: Not on file  ?Intimate Partner Violence: Not on file  ? ? ?Review of Systems: ? ?All other review of systems negative except as mentioned in the HPI. ? ?Physical Exam: ?Vital signs ?BP 123/60   Pulse 73   Temp 98.6 ?F (37 ?C) (Skin)   Ht 6' (1.829 m)   Wt 233 lb (105.7 kg)   SpO2 95%   BMI 31.60 kg/m?  ? ?General:   Alert,  Well-developed, well-nourished, pleasant and cooperative in NAD ?Airway:  Mallampati 2 ?Lungs:  Clear throughout to auscultation.   ?Heart:  Regular rate and rhythm; no murmurs, clicks, rubs,  or gallops. ?Abdomen:  Soft, nontender and nondistended. Normal bowel sounds.   ?Neuro/Psych:  Normal mood and affect. A and O x 3 ? ? ?David Dansby E. Candis Schatz, MD ?[facility] ? ?

## 2021-05-29 ENCOUNTER — Telehealth: Payer: Self-pay | Admitting: *Deleted

## 2021-05-29 NOTE — Telephone Encounter (Signed)
?  Follow up Call- ? ? ?  05/27/2021  ?  3:17 PM  ?Call back number  ?Post procedure Call Back phone  # [phone]  ?Permission to leave phone message Yes  ? LMOM to call back with any questions or concerns.  ?

## 2021-05-29 NOTE — Telephone Encounter (Signed)
Attempted to call patient for their post-procedure follow-up call. No answer. Left voicemail.   

## 2021-06-04 ENCOUNTER — Encounter: Payer: Self-pay | Admitting: Gastroenterology

## 2022-11-19 ENCOUNTER — Emergency Department (HOSPITAL_COMMUNITY)
Admission: EM | Admit: 2022-11-19 | Discharge: 2022-11-20 | Disposition: A | Discharge status: Home / Self Care | Payer: No Typology Code available for payment source | Attending: Emergency Medicine | Admitting: Emergency Medicine

## 2022-11-19 DIAGNOSIS — R197 Diarrhea, unspecified: Secondary | ICD-10-CM | POA: Insufficient documentation

## 2022-11-19 DIAGNOSIS — R1013 Epigastric pain: Secondary | ICD-10-CM | POA: Insufficient documentation

## 2022-11-19 DIAGNOSIS — R112 Nausea with vomiting, unspecified: Secondary | ICD-10-CM | POA: Insufficient documentation

## 2022-11-20 ENCOUNTER — Other Ambulatory Visit: Payer: Self-pay

## 2022-11-20 ENCOUNTER — Encounter (HOSPITAL_COMMUNITY): Payer: Self-pay

## 2022-11-20 LAB — COMPREHENSIVE METABOLIC PANEL
ALT: 16 U/L (ref 0–44)
AST: 22 U/L (ref 15–41)
Albumin: 4.5 g/dL (ref 3.5–5.0)
Alkaline Phosphatase: 53 U/L (ref 38–126)
Anion gap: 9 (ref 5–15)
BUN: 15 mg/dL (ref 6–20)
CO2: 24 mmol/L (ref 22–32)
Calcium: 9.1 mg/dL (ref 8.9–10.3)
Chloride: 103 mmol/L (ref 98–111)
Creatinine, Ser: 0.97 mg/dL (ref 0.61–1.24)
GFR, Estimated: >60 mL/min (ref >60)
Glucose, Bld: 117 mg/dL — ABNORMAL HIGH (ref 70–99)
Potassium: 3.6 mmol/L (ref 3.5–5.1)
Sodium: 136 mmol/L (ref 135–145)
Total Bilirubin: 0.3 mg/dL (ref 0.3–1.2)
Total Protein: 7.3 g/dL (ref 6.5–8.1)

## 2022-11-20 LAB — CBC
HCT: 40.9 % (ref 39.0–52.0)
Hemoglobin: 14.1 g/dL (ref 13.0–17.0)
MCH: 30.5 pg (ref 26.0–34.0)
MCHC: 34.5 g/dL (ref 30.0–36.0)
MCV: 88.5 fL (ref 80.0–100.0)
Platelets: 215 10*3/uL (ref 150–400)
RBC: 4.62 MIL/uL (ref 4.22–5.81)
RDW: 12.4 % (ref 11.5–15.5)
WBC: 8.2 10*3/uL (ref 4.0–10.5)
nRBC: 0 % (ref 0.0–0.2)

## 2022-11-20 LAB — LIPASE, BLOOD: Lipase: 35 U/L (ref 11–51)

## 2022-11-20 MED ORDER — ONDANSETRON 4 MG PO TBDP: 4.0000 mg | ORAL_TABLET | Freq: Three times a day (TID) | ORAL | 0 refills | Status: DC | PRN

## 2022-11-20 MED ORDER — ONDANSETRON 4 MG PO TBDP
4.0000 mg | ORAL_TABLET | Freq: Once | ORAL | Status: DC | PRN
  Filled 2022-11-20: qty 1

## 2022-11-20 NOTE — ED Notes (Signed)
Pt given water per request for PO challenge. Call bell in place.

## 2022-11-20 NOTE — ED Provider Notes (Signed)
[facility] AT [facility] Provider Note   CSN: [id number] Arrival date & time: 11/19/22  2351     History  Chief Complaint  Patient presents with   Abdominal Pain    David Rivers Savas Elvin. is a 37 y.o. male.  The history is provided by the patient.  Abdominal Pain David Rivers David Rivers. is a 37 y.o. male who presents to the Emergency Department complaining of vomiting and diarrhea.  He presents to the emergency department for evaluation of vomiting and diarrhea that started yesterday.  He is not having fevers but does have hot flashes at times.  No hematemesis.  He does have associated central and upper abdominal pain.  He has a history of IBS but this feels different.  No known sick contacts.  He received ondansetron at time of ED evaluation and states that this did help his nausea.  No prior abdominal surgeries.     Home Medications Prior to Admission medications   Medication Sig Start Date End Date Taking? Authorizing Provider  bismuth subsalicylate (PEPTO BISMOL) 262 MG/15ML suspension Take 30 mLs by mouth every 6 (six) hours as needed for indigestion or diarrhea or loose stools.   Yes [provider]  ondansetron (ZOFRAN-ODT) 4 MG disintegrating tablet Take 1 tablet (4 mg total) by mouth every 8 (eight) hours as needed. 11/20/22  Yes Tilden Fossa, MD  Multiple Vitamin (MULTIVITAMIN) capsule Take 1 capsule by mouth daily. Patient not taking: Reported on 11/20/2022    [provider]  pantoprazole (PROTONIX) 20 MG tablet Take 1 tablet (20 mg total) by mouth daily. Patient not taking: Reported on 11/20/2022 03/24/21   Rolan Bucco, MD      Allergies    Patient has no known allergies.    Review of Systems   Review of Systems  Gastrointestinal:  Positive for abdominal pain.  All other systems reviewed and are negative.   Physical Exam Updated Vital Signs BP 124/80 (BP Location: Left Arm)   Pulse 68   Temp 98.3 F  (36.8 C) (Oral)   Resp 18   Ht 5\' 11"  (1.803 m)   Wt 97.5 kg   SpO2 98%   BMI 29.99 kg/m  Physical Exam Vitals and nursing note reviewed.  Constitutional:      Appearance: He is well-developed.  HENT:     Head: Normocephalic and atraumatic.  Cardiovascular:     Rate and Rhythm: Normal rate and regular rhythm.  Pulmonary:     Effort: Pulmonary effort is normal. No respiratory distress.  Abdominal:     Palpations: Abdomen is soft.     Tenderness: There is no guarding or rebound.     Comments: Mild epigastric tenderness  Musculoskeletal:        General: No tenderness.  Skin:    General: Skin is warm and dry.  Neurological:     Mental Status: He is alert and oriented to person, place, and time.  Psychiatric:        Behavior: Behavior normal.     ED Results / Procedures / Treatments   Labs (all labs ordered are listed, but only abnormal results are displayed) Labs Reviewed  COMPREHENSIVE METABOLIC PANEL - Abnormal; Notable for the following components:      Result Value   Glucose, Bld 117 (*)    All other components within normal limits  LIPASE, BLOOD  CBC    EKG None  Radiology No results found.  Procedures Procedures  Medications Ordered in ED Medications  ondansetron (ZOFRAN-ODT) disintegrating tablet 4 mg (has no administration in time range)    ED Course/ Medical Decision Making/ A&P                                 Medical Decision Making Amount and/or Complexity of Data Reviewed Labs: ordered.  Risk Prescription drug management.   Patient with history of IBS here for evaluation of vomiting and diarrhea.  He has mild epigastric tenderness on examination without peritoneal findings.  Labs are unrevealing.  He received an oral antiemetic with significant improvement in his nausea.  He was provided a p.o. challenge to the emergency department and able to tolerate p.o.  Current clinical picture is not consistent with cholecystitis, pancreatitis,  appendicitis, SBO, perforated viscus.  Discussed with patient close return precautions if he does have worsening pain or is unable to tolerate p.o. fluids.  Patient seen in the setting of IV fluid shortage, treated with oral rehydration.        Final Clinical Impression(s) / ED Diagnoses Final diagnoses:  Nausea vomiting and diarrhea    Rx / DC Orders ED Discharge Orders          Ordered    ondansetron (ZOFRAN-ODT) 4 MG disintegrating tablet  Every 8 hours PRN        11/20/22 0605              Tilden Fossa, MD 11/20/22 [phone]

## 2022-11-20 NOTE — ED Triage Notes (Signed)
Pt POV d/t ABD pain left side with N/V and bloating - pt has hx of IBS but feels different.  Onset yesterday morning.

## 2022-11-20 NOTE — ED Notes (Signed)
Pt tolerated PO challenge

## 2023-04-20 IMAGING — CT CT ABD-PELV W/ CM
2 of 4 series · 17 of 46 positions shown, 19 images · IV contrast (agent unspecified)
Comparison: None.

CLINICAL DATA: Acute abdominal pain and rectal bleeding, initial
encounter

EXAM:
CT ABDOMEN AND PELVIS WITH CONTRAST
TECHNIQUE: Multidetector CT imaging of the abdomen and pelvis was performed
using the standard protocol following bolus administration of
intravenous contrast.

[Series 2: axial st · axial · 0.86mm/px · z∈[-525,-45]mm · 14 of 110 slices shown, 16 images]
[im 7/110  soft-tissue]
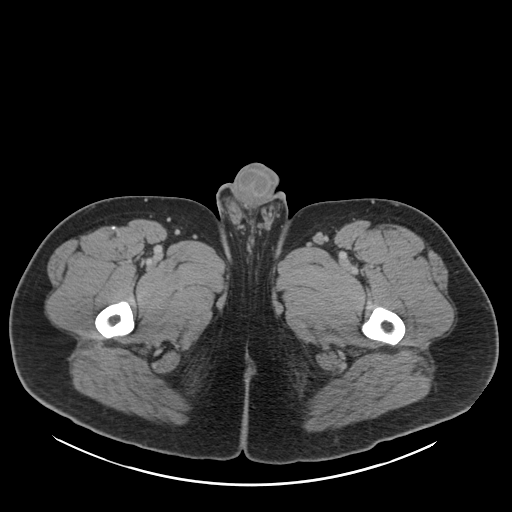
[im 7/110  bone]
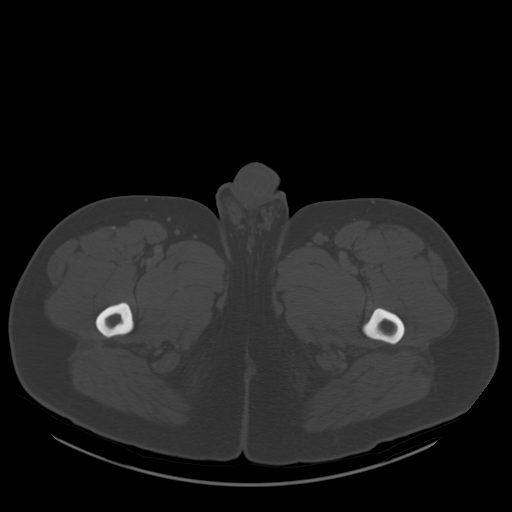
[im 13/110  soft-tissue]
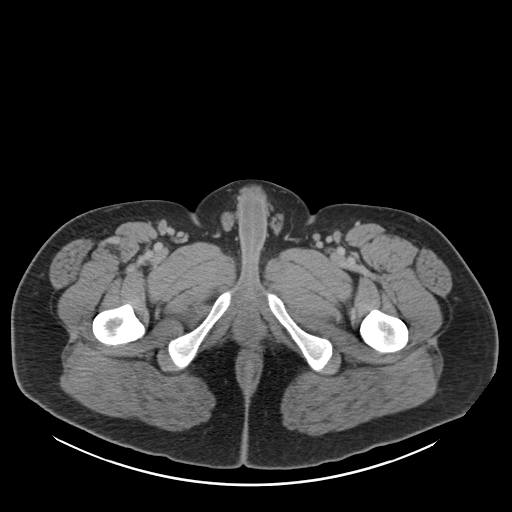
[im 20/110  soft-tissue]
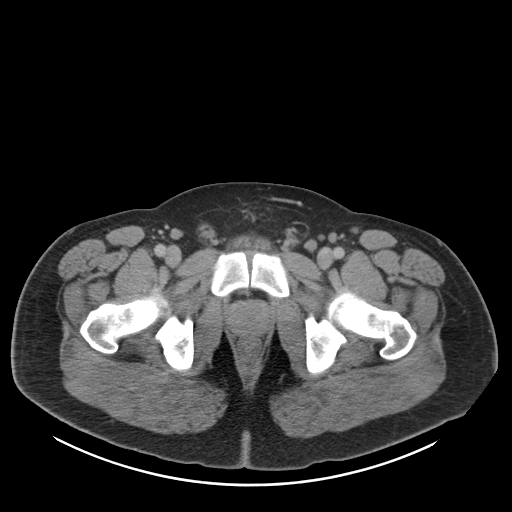
[im 33/110  soft-tissue]
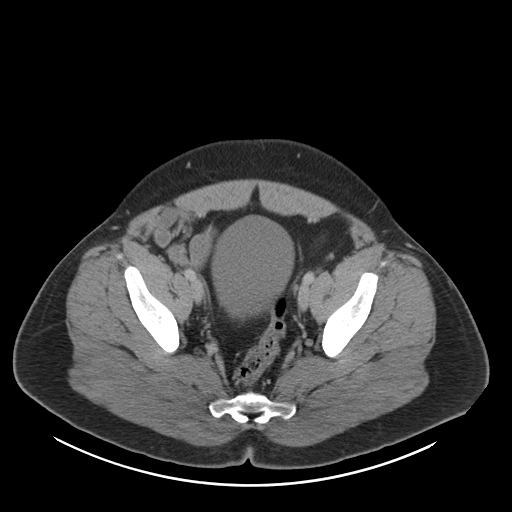
[im 39/110  soft-tissue]
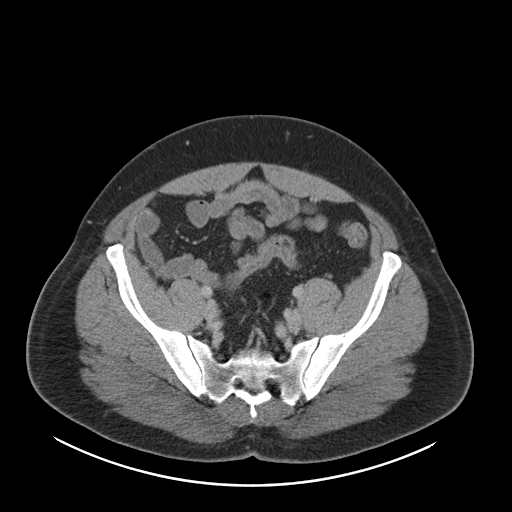
[im 45/110  soft-tissue]
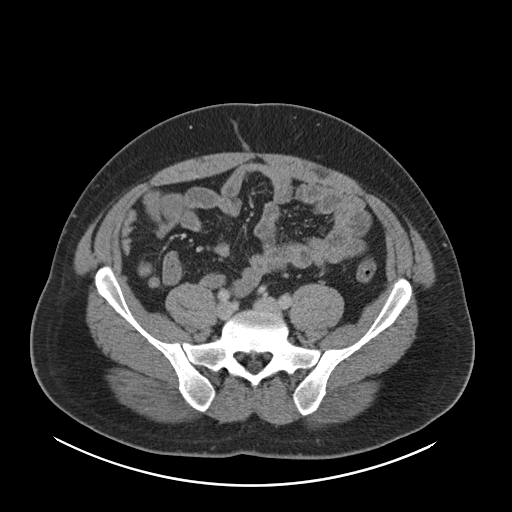
[im 52/110  soft-tissue]
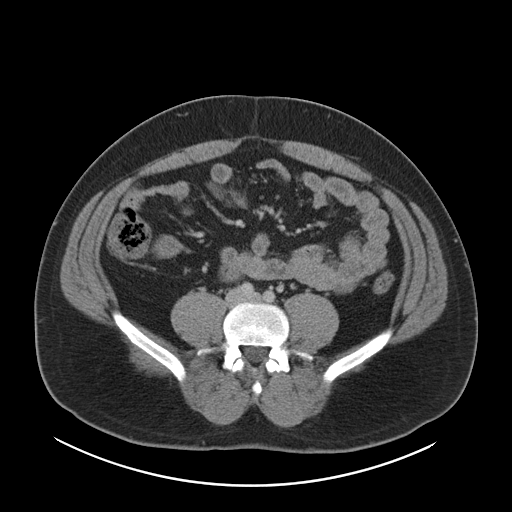
[im 58/110  soft-tissue]
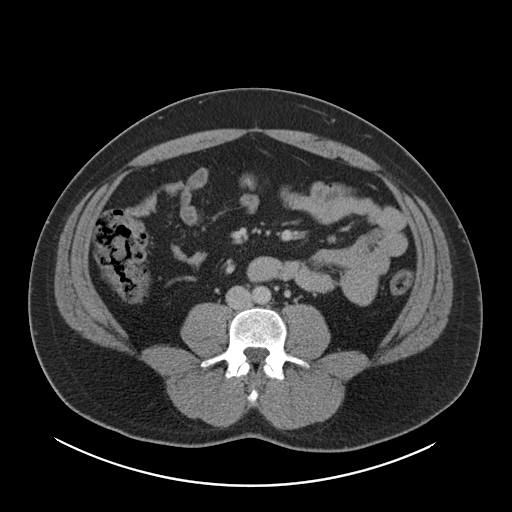
[im 65/110  soft-tissue]
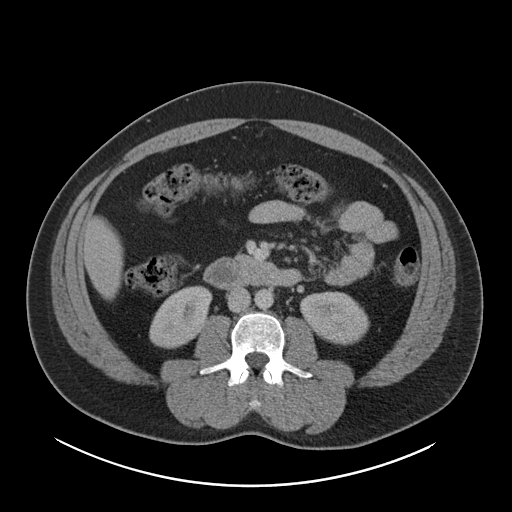
[im 65/110  bone]
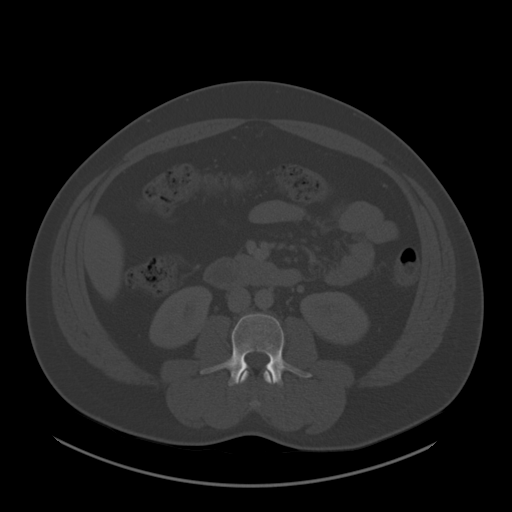
[im 71/110  soft-tissue]
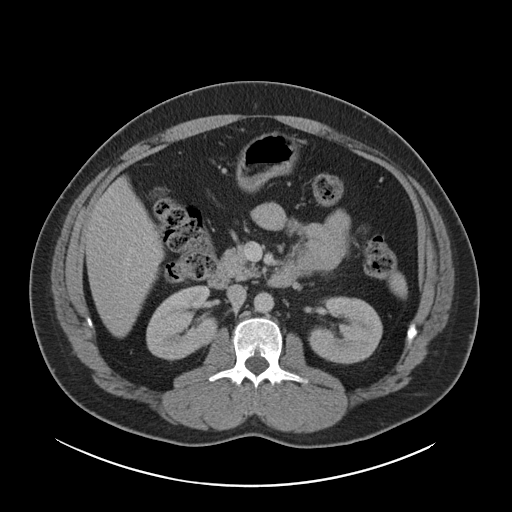
[im 84/110  soft-tissue]
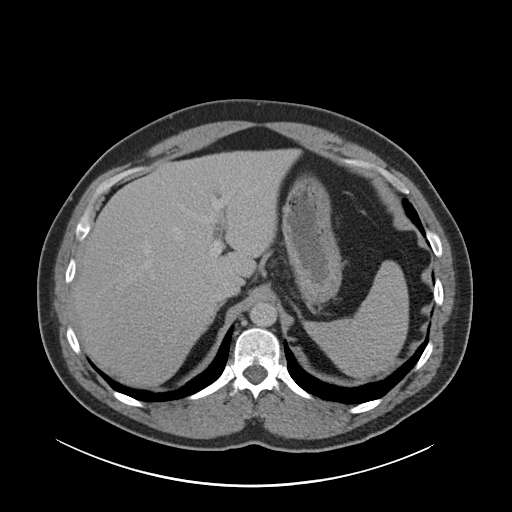
[im 90/110  soft-tissue]
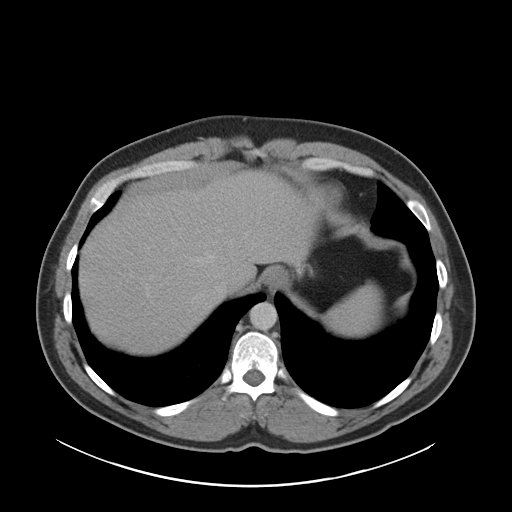
[im 97/110  soft-tissue]
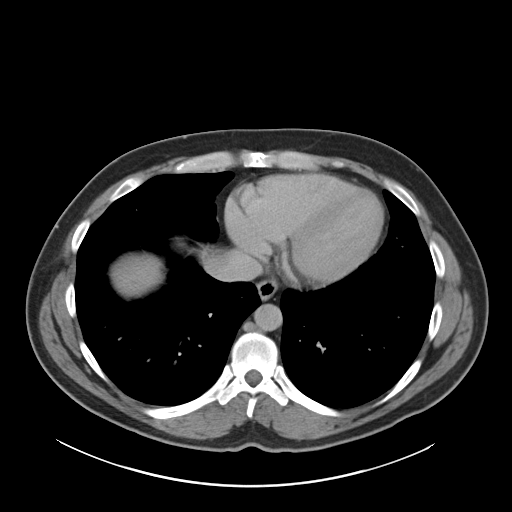
[im 103/110  soft-tissue]
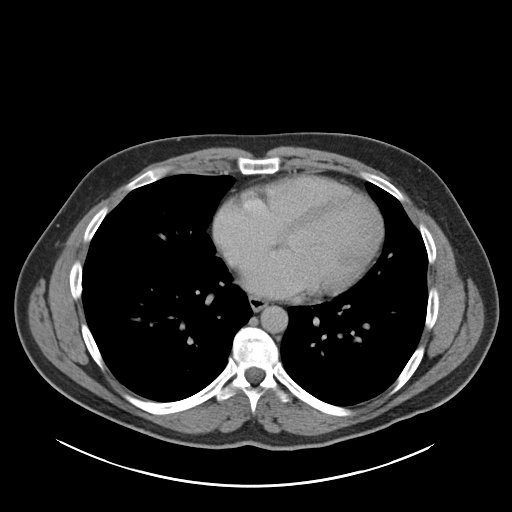

[Series 4: coronal st · coronal · 1.04mm/px · 3 of 159 slices shown]
[im 53/159  soft-tissue]
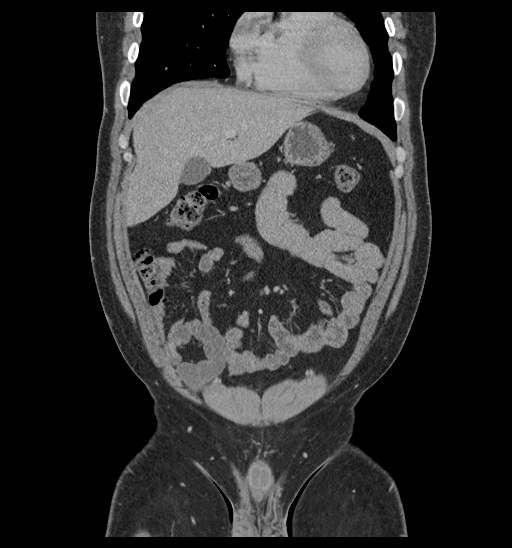
[im 71/159  soft-tissue]
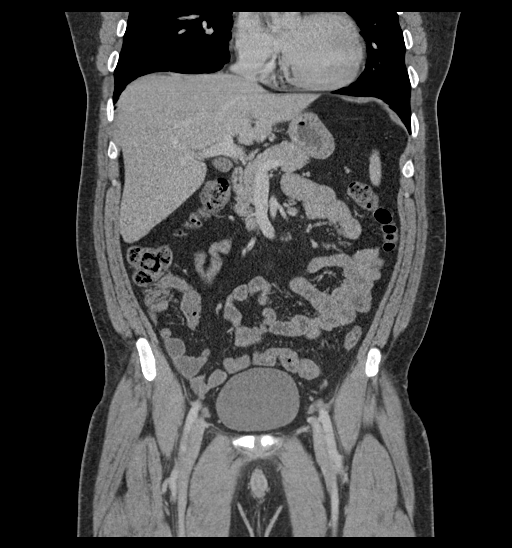
[im 88/159  soft-tissue]
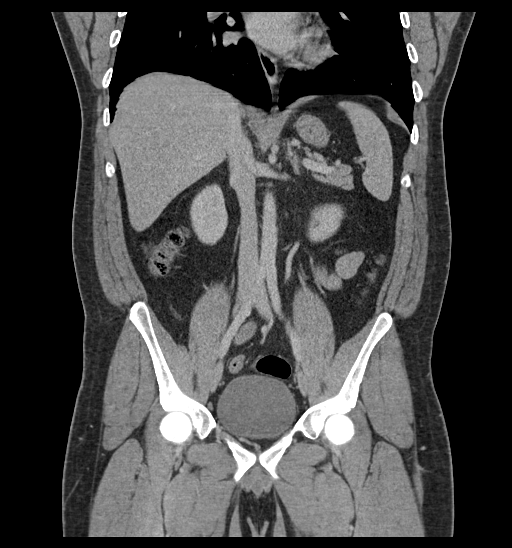

[17 of 46 positions shown; findings below may reference images not displayed]

RADIATION DOSE REDUCTION: This exam was performed according to the
departmental dose-optimization program which includes automated
exposure control, adjustment of the mA and/or kV according to
patient size and/or use of iterative reconstruction technique.

CONTRAST:  100mL OMNIPAQUE IOHEXOL 300 MG/ML  SOLN
FINDINGS: Lower chest: No acute abnormality.

Hepatobiliary: No focal liver abnormality is seen. No gallstones,
gallbladder wall thickening, or biliary dilatation.

Pancreas: Unremarkable. No pancreatic ductal dilatation or
surrounding inflammatory changes.

Spleen: Normal in size without focal abnormality.

Adrenals/Urinary Tract: Adrenal glands are within normal limits.
Kidneys are within normal limits as well. No renal calculi are seen.
The ureters are within normal limits. Bladder is well distended.

Stomach/Bowel: The appendix is within normal limits. No obstructive
or inflammatory changes of the colon are seen. Small bowel and
stomach are unremarkable.

Vascular/Lymphatic: No significant vascular findings are present. No
enlarged abdominal or pelvic lymph nodes.

Reproductive: Prostate is unremarkable.

Other: No abdominal wall hernia or abnormality. No abdominopelvic
ascites.

Musculoskeletal: No acute or significant osseous findings.
IMPRESSION: No acute abnormality noted to correspond with the patient's given
clinical symptomatology.

## 2023-09-18 ENCOUNTER — Encounter (HOSPITAL_COMMUNITY): Payer: Self-pay | Admitting: Emergency Medicine

## 2023-09-18 ENCOUNTER — Ambulatory Visit (HOSPITAL_COMMUNITY)
Admission: EM | Admit: 2023-09-18 | Discharge: 2023-09-18 | Disposition: A | Discharge status: Home / Self Care | Payer: Self-pay | Attending: Family Medicine | Admitting: Family Medicine

## 2023-09-18 ENCOUNTER — Other Ambulatory Visit: Payer: Self-pay

## 2023-09-18 DIAGNOSIS — K529 Noninfective gastroenteritis and colitis, unspecified: Secondary | ICD-10-CM

## 2023-09-18 DIAGNOSIS — J069 Acute upper respiratory infection, unspecified: Secondary | ICD-10-CM

## 2023-09-18 LAB — POC SARS CORONAVIRUS 2 AG -  ED: SARS Coronavirus 2 Ag: NEGATIVE

## 2023-09-18 MED ORDER — ONDANSETRON 4 MG PO TBDP
ORAL_TABLET | ORAL | Status: AC
  Filled 2023-09-18: qty 1

## 2023-09-18 MED ORDER — ONDANSETRON 4 MG PO TBDP
4.0000 mg | ORAL_TABLET | Freq: Once | ORAL | Status: AC
  Administered 2023-09-18: 4 mg via ORAL

## 2023-09-18 MED ORDER — ONDANSETRON 4 MG PO TBDP: 4.0000 mg | ORAL_TABLET | Freq: Three times a day (TID) | ORAL | 0 refills | Status: DC | PRN

## 2023-09-18 MED ORDER — DICYCLOMINE HCL 20 MG PO TABS: 20.0000 mg | ORAL_TABLET | Freq: Four times a day (QID) | ORAL | 0 refills | Status: DC | PRN

## 2023-09-18 NOTE — ED Triage Notes (Signed)
 Pt states he started having abd pain with vomiting this morning before going to work.

## 2023-09-18 NOTE — ED Provider Notes (Signed)
 [facility]    CSN: [id number] Arrival date & time: 09/18/23  1755      History   Chief Complaint Chief Complaint  Patient presents with   Abdominal Pain    HPI David Rivers David Rivers. is a 38 y.o. male.    Abdominal Pain Here for nausea and vomiting and abdominal pain.  Vomit once this morning continues to be nauseated.  His left lower abdomen will cramp at times.  He had temperature to 101 earlier today.  Yesterday he began having nasal congestion and cough.  NKDA  No diarrhea.  No shortness of breath  Past Medical History:  Diagnosis Date   IBS (irritable bowel syndrome)    Kidney stones    Partial deafness of both ears    Pneumonia    Spinal meningioma ([facility])     There are no active problems to display for this patient.   History reviewed. No pertinent surgical history.     Home Medications    Prior to Admission medications   Medication Sig Start Date End Date Taking? Authorizing Provider  dicyclomine  (BENTYL ) 20 MG tablet Take 1 tablet (20 mg total) by mouth 4 (four) times daily as needed (intestinal cramps). 09/18/23  Yes Vonna Sharlet POUR, MD  ondansetron  (ZOFRAN -ODT) 4 MG disintegrating tablet Take 1 tablet (4 mg total) by mouth every 8 (eight) hours as needed for nausea or vomiting. 09/18/23  Yes Lillah Standre K, MD  Multiple Vitamin (MULTIVITAMIN) capsule Take 1 capsule by mouth daily. Patient not taking: Reported on 11/20/2022    [provider]  pantoprazole  (PROTONIX ) 20 MG tablet Take 1 tablet (20 mg total) by mouth daily. Patient not taking: Reported on 11/20/2022 03/24/21   Lenor Hollering, MD    Family History Family History  Problem Relation Age of Onset   Multiple sclerosis Mother    Liver cancer Maternal Grandfather        kidney cancer   COPD Paternal Grandmother    Breast cancer Paternal Grandmother 55   COPD Paternal Grandfather    Diabetes Paternal Grandfather    Colon cancer Neg Hx     Social  History Social History   Tobacco Use   Smoking status: Some Days    Types: Cigarettes   Smokeless tobacco: Current    Types: Chew  Vaping Use   Vaping status: Never Used  Substance Use Topics   Alcohol use: Yes    Comment: occ   Drug use: Never     Allergies   Patient has no known allergies.   Review of Systems Review of Systems  Gastrointestinal:  Positive for abdominal pain.     Physical Exam Triage Vital Signs ED Triage Vitals [09/18/23 1841]  Encounter Vitals Group     BP (!) 142/94     Girls Systolic BP Percentile      Girls Diastolic BP Percentile      Boys Systolic BP Percentile      Boys Diastolic BP Percentile      Pulse Rate 83     Resp 18     Temp 98.2 F (36.8 C)     Temp Source Oral     SpO2 98 %     Weight      Height      Head Circumference      Peak Flow      Pain Score 7     Pain Loc      Pain Education  Exclude from Growth Chart    No data found.  Updated Vital Signs BP (!) 142/94 (BP Location: Right Arm)   Pulse 83   Temp 98.2 F (36.8 C) (Oral)   Resp 18   SpO2 98%   Visual Acuity Right Eye Distance:   Left Eye Distance:   Bilateral Distance:    Right Eye Near:   Left Eye Near:    Bilateral Near:     Physical Exam Vitals reviewed.  Constitutional:      General: He is not in acute distress.    Appearance: He is not toxic-appearing.  HENT:     Right Ear: Tympanic membrane and ear canal normal.     Left Ear: Tympanic membrane and ear canal normal.     Nose: Nose normal.     Mouth/Throat:     Mouth: Mucous membranes are moist.     Pharynx: No oropharyngeal exudate or posterior oropharyngeal erythema.  Eyes:     Extraocular Movements: Extraocular movements intact.     Conjunctiva/sclera: Conjunctivae normal.     Pupils: Pupils are equal, round, and reactive to light.  Cardiovascular:     Rate and Rhythm: Normal rate and regular rhythm.     Heart sounds: No murmur heard. Pulmonary:     Effort: Pulmonary effort  is normal. No respiratory distress.     Breath sounds: Normal breath sounds. No stridor. No wheezing, rhonchi or rales.  Abdominal:     General: Bowel sounds are normal. There is no distension.     Palpations: Abdomen is soft.     Tenderness: There is no abdominal tenderness. There is no guarding.  Musculoskeletal:     Cervical back: Neck supple.  Lymphadenopathy:     Cervical: No cervical adenopathy.  Skin:    Capillary Refill: Capillary refill takes less than 2 seconds.     Coloration: Skin is not jaundiced or pale.  Neurological:     General: No focal deficit present.     Mental Status: He is alert and oriented to person, place, and time.  Psychiatric:        Behavior: Behavior normal.      UC Treatments / Results  Labs (all labs ordered are listed, but only abnormal results are displayed) Labs Reviewed  POC SARS CORONAVIRUS 2 AG -  ED    EKG   Radiology No results found.  Procedures Procedures (including critical care time)  Medications Ordered in UC Medications  ondansetron  (ZOFRAN -ODT) disintegrating tablet 4 mg (4 mg Oral Given 09/18/23 1852)    Initial Impression / Assessment and Plan / UC Course  I have reviewed the triage vital signs and the nursing notes.  Pertinent labs & imaging results that were available during my care of the patient were reviewed by me and considered in my medical decision making (see chart for details).     COVID test is negative.  Zofran  is given here for the symptoms and Zofran  is sent to the pharmacy.  Also Bentyl  was sent in.  Since his cramping is intermittent I do not think that he needs treatment for any intra-abdominal process such as diverticulitis at this time.  I have asked him to proceed to the emergency room if he worsens in any way Final Clinical Impressions(s) / UC Diagnoses   Final diagnoses:  Acute upper respiratory infection  Gastroenteritis     Discharge Instructions      The COVID test is  negative.  Ondansetron  dissolved in the mouth  every 8 hours as needed for nausea or vomiting. Clear liquids(water, gatorade/pedialyte, ginger ale/sprite, chicken broth/soup) and bland things(crackers/toast, rice, potato, bananas) to eat. Avoid acidic foods like lemon/lime/orange/tomato, and avoid greasy/spicy foods. I gave you 1 dose of this medicine here in the clinic  Dicyclomine --take 1 every 6 hours as needed for intestinal cramps   If you worsen in any way, please go to emergency room     ED Prescriptions     Medication Sig Dispense Auth. Provider   ondansetron  (ZOFRAN -ODT) 4 MG disintegrating tablet Take 1 tablet (4 mg total) by mouth every 8 (eight) hours as needed for nausea or vomiting. 10 tablet Vonna Sharlet POUR, MD   dicyclomine  (BENTYL ) 20 MG tablet Take 1 tablet (20 mg total) by mouth 4 (four) times daily as needed (intestinal cramps). 20 tablet Uldine Fuster K, MD      PDMP not reviewed this encounter.   Vonna Sharlet POUR, MD 09/18/23 [phone]

## 2023-09-18 NOTE — Discharge Instructions (Addendum)
 The COVID test is negative.  Ondansetron  dissolved in the mouth every 8 hours as needed for nausea or vomiting. Clear liquids(water, gatorade/pedialyte, ginger ale/sprite, chicken broth/soup) and bland things(crackers/toast, rice, potato, bananas) to eat. Avoid acidic foods like lemon/lime/orange/tomato, and avoid greasy/spicy foods. I gave you 1 dose of this medicine here in the clinic  Dicyclomine --take 1 every 6 hours as needed for intestinal cramps   If you worsen in any way, please go to emergency room

## 2023-10-09 ENCOUNTER — Encounter (HOSPITAL_COMMUNITY): Payer: Self-pay | Admitting: Emergency Medicine

## 2023-10-09 ENCOUNTER — Ambulatory Visit (HOSPITAL_COMMUNITY)
Admission: EM | Admit: 2023-10-09 | Discharge: 2023-10-09 | Disposition: A | Discharge status: Home / Self Care | Attending: Family Medicine | Admitting: Family Medicine

## 2023-10-09 ENCOUNTER — Other Ambulatory Visit: Payer: Self-pay

## 2023-10-09 DIAGNOSIS — H9203 Otalgia, bilateral: Secondary | ICD-10-CM | POA: Diagnosis not present

## 2023-10-09 MED ORDER — AMOXICILLIN 875 MG PO TABS: 875.0000 mg | ORAL_TABLET | Freq: Two times a day (BID) | ORAL | 0 refills | Status: AC

## 2023-10-09 MED ORDER — PREDNISONE 50 MG PO TABS: ORAL_TABLET | ORAL | 0 refills | Status: AC

## 2023-10-09 NOTE — ED Triage Notes (Signed)
 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  Greenish mucus, cough, bilateral ear fullness, and diarrhea.  Has had 2 episodes of diarrhea today  Symptoms started wednesday Wife diagnosed with walking pneumonia on Wednesday.  Patient has been taking mucinex.

## 2023-10-13 NOTE — ED Provider Notes (Signed)
 [facility]   [id number] 10/09/23 Arrival Time: 1746  ASSESSMENT & PLAN:  1. Acute otalgia, bilateral    Suspect forming OM. Meds ordered this encounter  Medications   predniSONE  (DELTASONE ) 50 MG tablet    Sig: Take one tablet by mouth for 5 days.    Dispense:  5 tablet    Refill:  0   amoxicillin  (AMOXIL ) 875 MG tablet    Sig: Take 1 tablet (875 mg total) by mouth 2 (two) times daily for 7 days.    Dispense:  14 tablet    Refill:  0      Follow-up Information     [facility].   Specialty: Urgent Care Why: If worsening or failing to improve as anticipated. Contact information: [address] [place]  [address] [phone]                Reviewed expectations re: course of current medical issues. Questions answered. Outlined signs and symptoms indicating need for more acute intervention. Understanding verbalized. After Visit Summary given.   SUBJECTIVE: History from: Patient. David Rivers. is a 38 y.o. male. 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  Greenish mucus, cough, bilateral ear fullness, and diarrhea.  Has had 2 episodes of diarrhea today  Symptoms started wednesday Wife diagnosed with walking pneumonia on Wednesday.  Patient has been taking mucinex.   Denies: fever. Normal PO intake without n/v/d.  OBJECTIVE:  Vitals:   10/09/23 1756  BP: 124/82  Pulse: 97  Resp: 18  Temp: 98.4 F (36.9 C)  TempSrc: Oral  SpO2: 96%    General appearance: alert; no distress Eyes: PERRLA; EOMI; conjunctiva normal HENT: [place]; AT; with nasal congestion; both TM with slight erythema and bulging Neck: supple  Lungs: speaks full sentences without difficulty; unlabored Extremities: no edema Skin: warm and dry Neurologic: normal gait Psychological: alert and cooperative; normal mood and affect  Labs:  Labs Reviewed - No data to display  Imaging: No results found.  No Known  Allergies  Past Medical History:  Diagnosis Date   IBS (irritable bowel syndrome)    Kidney stones    Partial deafness of both ears    Pneumonia    Spinal meningioma (HCC)    Social History   Socioeconomic History   Marital status: Married    Spouse name: Not on file   Number of children: Not on file   Years of education: Not on file   Highest education level: Not on file  Occupational History   Not on file  Tobacco Use   Smoking status: Some Days    Types: Cigarettes   Smokeless tobacco: Current    Types: Chew  Vaping Use   Vaping status: Never Used  Substance and Sexual Activity   Alcohol use: Yes    Comment: occ   Drug use: Never   Sexual activity: Not on file  Other Topics Concern   Not on file  Social History Narrative   Not on file   Social Drivers of Health   Financial Resource Strain: Not on file  Food Insecurity: Not on file  Transportation Needs: Not on file  Physical Activity: Not on file  Stress: Not on file  Social Connections: Not on file  Intimate Partner Violence: Not on file   Family History  Problem Relation Age of Onset   Multiple sclerosis Mother    Liver cancer Maternal Grandfather  kidney cancer   COPD Paternal Grandmother    Breast cancer Paternal Grandmother 39   COPD Paternal Grandfather    Diabetes Paternal Grandfather    Colon cancer Neg Hx    History reviewed. No pertinent surgical history.   Rolinda Rogue, MD 10/13/23 [phone]

## 2023-12-20 ENCOUNTER — Ambulatory Visit (HOSPITAL_COMMUNITY): Admission: EM | Admit: 2023-12-20 | Discharge: 2023-12-20 | Disposition: A | Discharge status: Home / Self Care

## 2023-12-20 ENCOUNTER — Encounter (HOSPITAL_COMMUNITY): Payer: Self-pay | Admitting: *Deleted

## 2023-12-20 DIAGNOSIS — R112 Nausea with vomiting, unspecified: Secondary | ICD-10-CM

## 2023-12-20 DIAGNOSIS — R051 Acute cough: Secondary | ICD-10-CM | POA: Diagnosis not present

## 2023-12-20 DIAGNOSIS — B349 Viral infection, unspecified: Secondary | ICD-10-CM | POA: Diagnosis not present

## 2023-12-20 NOTE — ED Triage Notes (Signed)
 Pt states he has been vomiting and having a fever since Saturday. He has ate today and not vomited since. He states that he feels better but his work needs a note. He complains of left ear fullness for a couple of days.

## 2023-12-20 NOTE — Discharge Instructions (Addendum)
 As discussed I believe your symptoms are likely related to a viral illness. It is reassuring that your symptoms have significantly resolved. You can take over-the-counter Mucinex if needed for cough and congestion. Otherwise alternate between Tylenol and ibuprofen as needed for any pain. Make sure you are staying hydrated getting plenty of rest. Follow-up with your primary care provider or return here as needed

## 2023-12-20 NOTE — ED Provider Notes (Signed)
 [facility]    CSN: [id number] Arrival date & time: 12/20/23  1948      History   Chief Complaint Chief Complaint  Patient presents with   Letter for School/Work   Emesis   Ear Fullness    HPI Ahmeer Tuman Xeng Kucher. is a 38 y.o. male.   Patient presents requesting a return to work note.  Patient states that on 11/29 he began to have some mild cough, congestion, nausea, and vomiting.  Patient states that he did do some recent traveling and thinks he may have picked up something along the way.  Patient denies chest pain, shortness of breath, abdominal pain, diarrhea, and lethargy.  Patient states that he is no longer having any symptoms but just needs a note to return to work.  The history is provided by the patient and medical records.  Emesis Ear Fullness    Past Medical History:  Diagnosis Date   IBS (irritable bowel syndrome)    Kidney stones    Partial deafness of both ears    Pneumonia    Spinal meningioma (HCC)     There are no active problems to display for this patient.   History reviewed. No pertinent surgical history.     Home Medications    Prior to Admission medications   Medication Sig Start Date End Date Taking? Authorizing Provider  Multiple Vitamin (MULTIVITAMIN) capsule Take 1 capsule by mouth daily. Patient not taking: Reported on 11/20/2022    [provider]  pantoprazole  (PROTONIX ) 20 MG tablet Take 1 tablet (20 mg total) by mouth daily. Patient not taking: Reported on 11/20/2022 03/24/21   Lenor Hollering, MD  predniSONE  (DELTASONE ) 50 MG tablet Take one tablet by mouth for 5 days. 10/09/23   Rolinda Rogue, MD    Family History Family History  Problem Relation Age of Onset   Multiple sclerosis Mother    Liver cancer Maternal Grandfather        kidney cancer   COPD Paternal Grandmother    Breast cancer Paternal Grandmother 34   COPD Paternal Grandfather    Diabetes Paternal Grandfather    Colon cancer Neg Hx      Social History Social History   Tobacco Use   Smoking status: Some Days    Types: Cigarettes   Smokeless tobacco: Current    Types: Chew  Vaping Use   Vaping status: Never Used  Substance Use Topics   Alcohol use: Yes    Comment: occ   Drug use: Never     Allergies   Patient has no known allergies.   Review of Systems Review of Systems  Gastrointestinal:  Positive for vomiting.   Per HPI  Physical Exam Triage Vital Signs ED Triage Vitals  Encounter Vitals Group     BP 12/20/23 2042 (!) 146/78     Girls Systolic BP Percentile --      Girls Diastolic BP Percentile --      Boys Systolic BP Percentile --      Boys Diastolic BP Percentile --      Pulse Rate 12/20/23 2042 81     Resp 12/20/23 2042 16     Temp 12/20/23 2040 98.6 F (37 C)     Temp src --      SpO2 12/20/23 2042 97 %     Weight --      Height --      Head Circumference --      Peak Flow --  Pain Score 12/20/23 2040 0     Pain Loc --      Pain Education --      Exclude from Growth Chart --    No data found.  Updated Vital Signs BP (!) 146/78 (BP Location: Right Arm)   Pulse 81   Temp 98.6 F (37 C)   Resp 16   SpO2 97%   Visual Acuity Right Eye Distance:   Left Eye Distance:   Bilateral Distance:    Right Eye Near:   Left Eye Near:    Bilateral Near:     Physical Exam Vitals and nursing note reviewed.  Constitutional:      General: He is awake. He is not in acute distress.    Appearance: Normal appearance. He is well-developed and well-groomed. He is not ill-appearing.  HENT:     Right Ear: Tympanic membrane, ear canal and external ear normal.     Left Ear: Tympanic membrane, ear canal and external ear normal.     Nose: Congestion and rhinorrhea present.     Mouth/Throat:     Mouth: Mucous membranes are moist.     Pharynx: Posterior oropharyngeal erythema present. No oropharyngeal exudate.  Cardiovascular:     Rate and Rhythm: Normal rate and regular rhythm.   Pulmonary:     Effort: Pulmonary effort is normal.     Breath sounds: Normal breath sounds.  Abdominal:     General: Abdomen is flat. Bowel sounds are normal. There is no distension.     Palpations: Abdomen is soft. There is no mass.     Tenderness: There is no abdominal tenderness. There is no guarding or rebound.     Hernia: No hernia is present.  Skin:    General: Skin is warm and dry.  Neurological:     General: No focal deficit present.     Mental Status: He is alert and oriented to person, place, and time. Mental status is at baseline.  Psychiatric:        Behavior: Behavior is cooperative.      UC Treatments / Results  Labs (all labs ordered are listed, but only abnormal results are displayed) Labs Reviewed - No data to display  EKG   Radiology No results found.  Procedures Procedures (including critical care time)  Medications Ordered in UC Medications - No data to display  Initial Impression / Assessment and Plan / UC Course  I have reviewed the triage vital signs and the nursing notes.  Pertinent labs & imaging results that were available during my care of the patient were reviewed by me and considered in my medical decision making (see chart for details).     Patient is overall well-appearing.  Vitals are stable.  Mild congestion rhinorrhea present, mild erythema noted to posterior oropharynx.  No other significant findings on exam.  Symptoms likely viral in nature.  Discussed over-the-counter medications for symptoms.  Provided patient with work note as requested.  Discussed follow-up and return precautions. Final Clinical Impressions(s) / UC Diagnoses   Final diagnoses:  Viral illness  Nausea and vomiting, unspecified vomiting type  Acute cough     Discharge Instructions      As discussed I believe your symptoms are likely related to a viral illness. It is reassuring that your symptoms have significantly resolved. You can take  over-the-counter Mucinex if needed for cough and congestion. Otherwise alternate between Tylenol and ibuprofen as needed for any pain. Make sure you are staying hydrated  getting plenty of rest. Follow-up with your primary care provider or return here as needed   ED Prescriptions   None    PDMP not reviewed this encounter.   Johnie Flaming A, NP 12/20/23 2100
